# Patient Record
Sex: Female | Born: 1965 | Race: White | Hispanic: No | Marital: Married | Smoking: Never smoker
Health system: Southern US, Community
[De-identification: ages and names within clinical notes are randomized; demographics above are authoritative.]

## PROBLEM LIST (undated history)

## (undated) DIAGNOSIS — F419 Anxiety disorder, unspecified: Secondary | ICD-10-CM

---

## 2004-12-31 ENCOUNTER — Ambulatory Visit: Payer: Self-pay

## 2007-02-28 ENCOUNTER — Ambulatory Visit: Payer: Self-pay | Admitting: Obstetrics and Gynecology

## 2008-04-23 ENCOUNTER — Ambulatory Visit: Payer: Self-pay | Admitting: Obstetrics and Gynecology

## 2009-05-02 ENCOUNTER — Ambulatory Visit: Payer: Self-pay | Admitting: Obstetrics and Gynecology

## 2010-05-22 ENCOUNTER — Ambulatory Visit: Payer: Self-pay | Admitting: Obstetrics and Gynecology

## 2011-02-10 ENCOUNTER — Ambulatory Visit: Payer: Self-pay | Admitting: Unknown Physician Specialty

## 2011-05-26 ENCOUNTER — Ambulatory Visit: Payer: Self-pay | Admitting: Obstetrics and Gynecology

## 2012-02-03 ENCOUNTER — Ambulatory Visit: Payer: Self-pay

## 2012-02-03 LAB — RAPID INFLUENZA A&B ANTIGENS

## 2012-06-02 ENCOUNTER — Ambulatory Visit: Payer: Self-pay | Admitting: Obstetrics and Gynecology

## 2012-06-14 ENCOUNTER — Ambulatory Visit: Payer: Self-pay | Admitting: Obstetrics and Gynecology

## 2012-12-19 ENCOUNTER — Ambulatory Visit: Payer: Self-pay | Admitting: Obstetrics and Gynecology

## 2013-06-13 ENCOUNTER — Ambulatory Visit: Payer: Self-pay | Admitting: Obstetrics and Gynecology

## 2013-12-30 IMAGING — MG MM DIGITAL SCREENING BILAT W/ CAD
1 series · 4 of 4 positions shown · non-contrast
Comparison: none

REASON FOR EXAM: SCR MAMMO NO ORDER
COMMENTS:

[R CC · right · 4 of 4 slices shown]
[im 1/4]
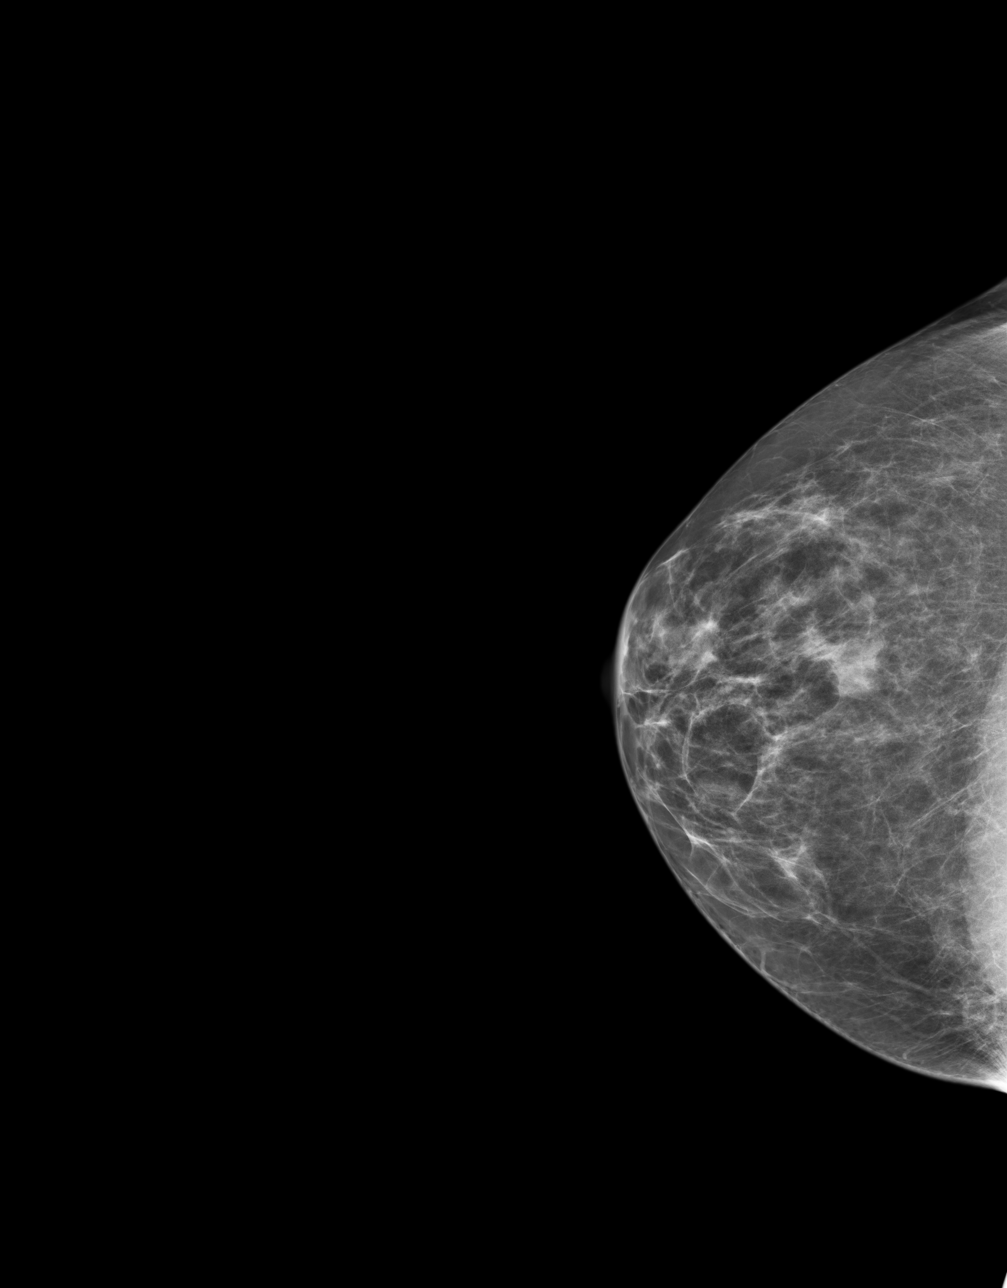
[im 2/4]
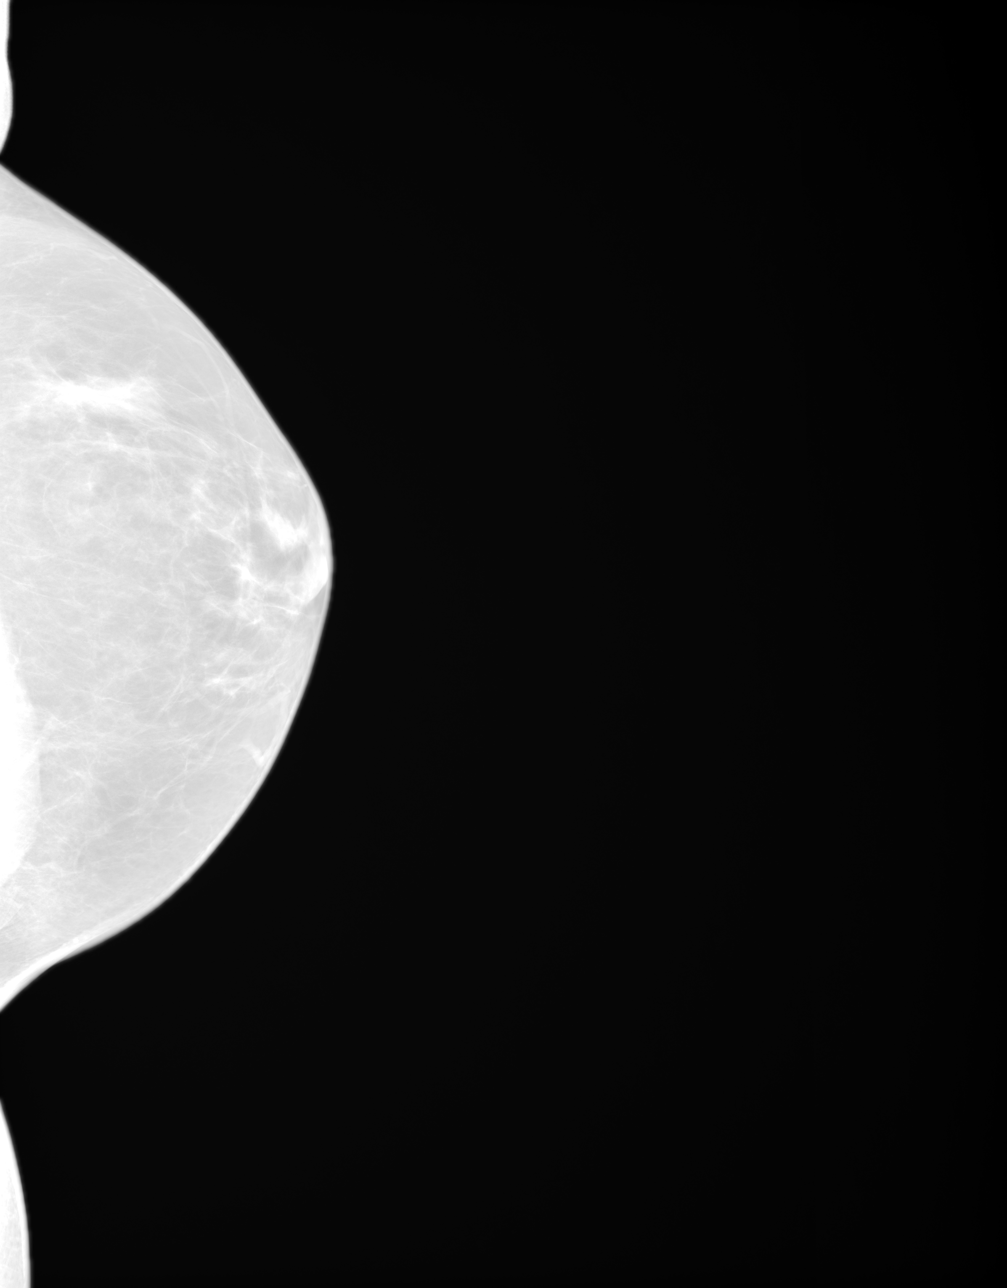
[im 3/4]
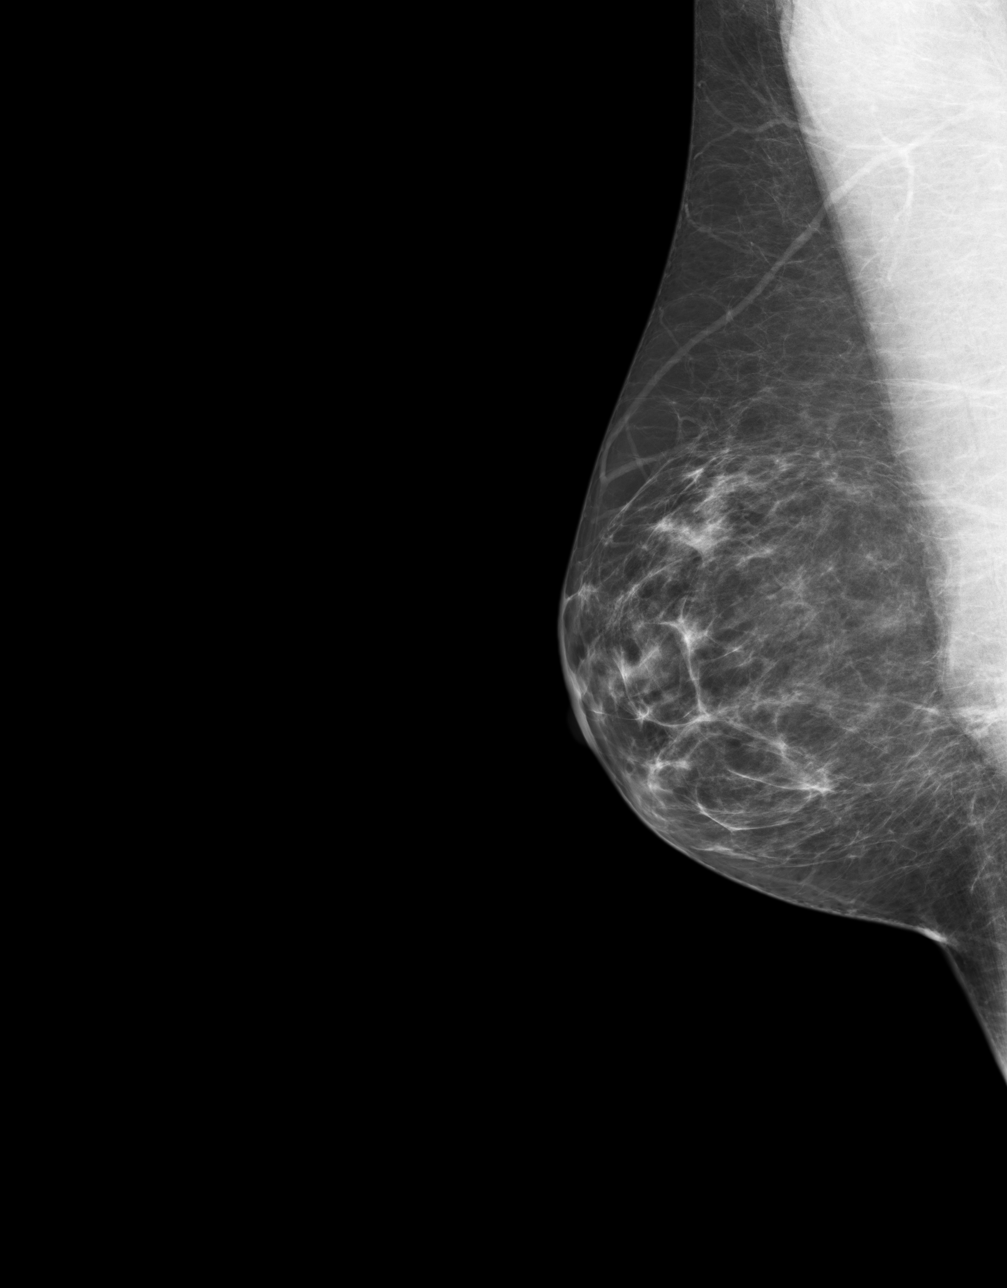
[im 4/4]
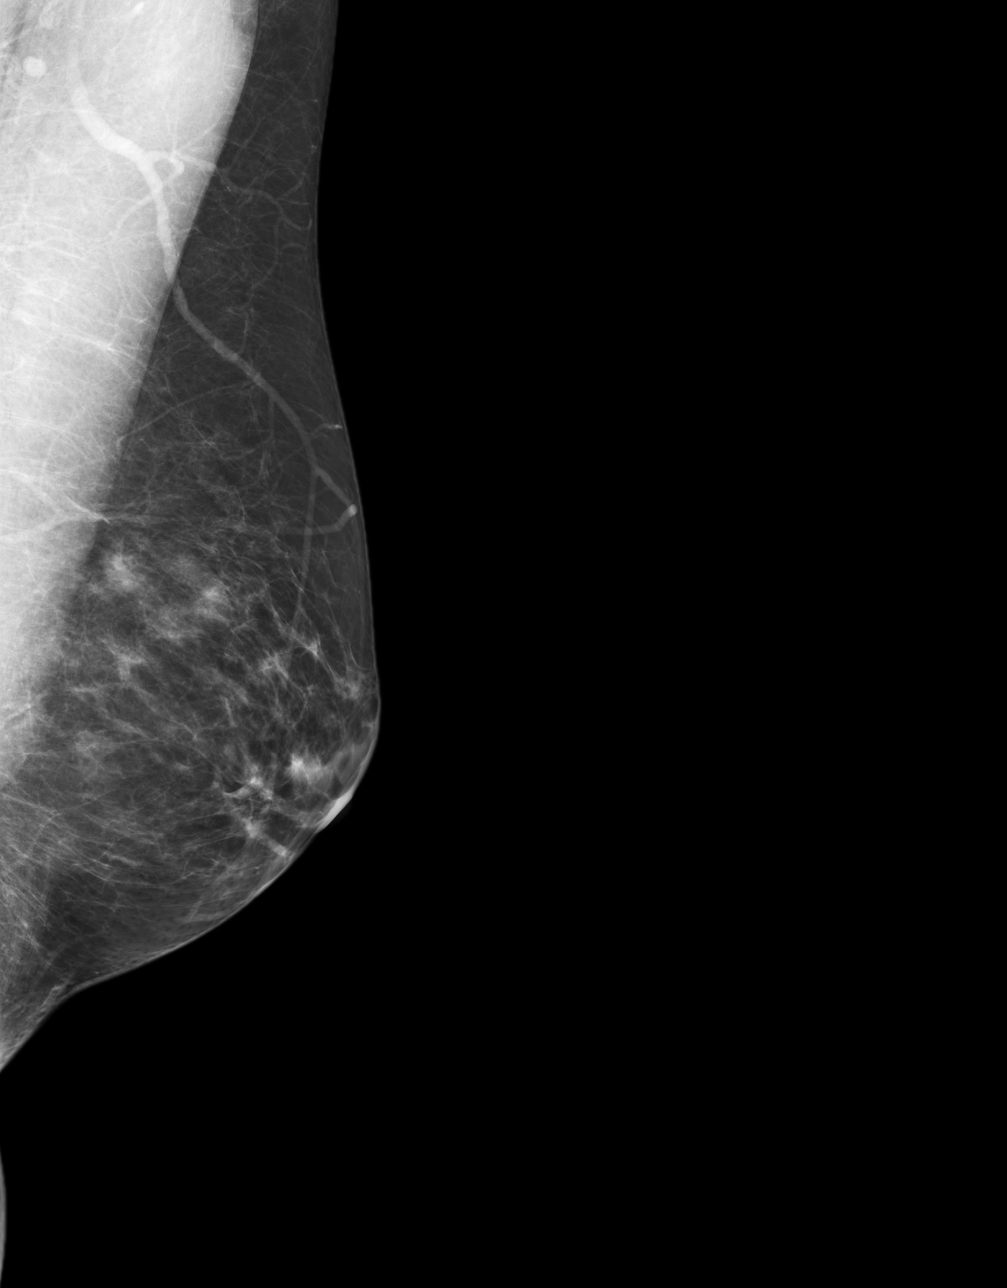

[4 of 4 positions shown; findings below may reference images not displayed]

PROCEDURE:     MMM - MMM DGT SCR NO ORDER W/CAD  - June 13, 2013  [DATE]

RESULT:     There is a family history of breast cancer in the patient's
paternal aunt. The patient has no previous breast surgery or breast cancer
history. Comparison is made to unilateral right breast images dated
12/19/2012 and to bilateral digital images dated 06/02/2012, 05/26/2011 and
04/23/2008 area there is a stable heterogeneous pattern of density in the
breasts without a dominant mass, developing density or malignant appearing
calcification. There is only mild parenchymal density with some asymmetric
parenchymal elements. No malignant calcification or architectural distortion
is present.
IMPRESSION: Stable, benign appearing bilateral mammogram. Please
continue to encourage annual mammographic followup and monthly breast self
exam. BREAST COMPOSITION: The breast composition is SCATTERED FIBROGLANDULAR
TISSUE (glandular tissue is 25-50%) BI-RADS: Category 2- Benign Finding

A NEGATIVE MAMMOGRAM REPORT DOES NOT PRECLUDE BIOPSY OR OTHER EVALUATION OF
A CLINICALLY PALPABLE OR OTHERWISE SUSPICIOUS MASS OR LESION. BREAST CANCER
MAY NOT BE DETECTED IN UP TO 10% OF CASES.

[REDACTED]

## 2014-02-02 ENCOUNTER — Ambulatory Visit: Payer: Self-pay | Admitting: Family Medicine

## 2014-06-14 ENCOUNTER — Ambulatory Visit: Payer: Self-pay | Admitting: Obstetrics and Gynecology

## 2014-06-21 ENCOUNTER — Ambulatory Visit: Payer: Self-pay | Admitting: Obstetrics and Gynecology

## 2015-04-11 ENCOUNTER — Ambulatory Visit
Admit: 2015-04-11 | Disposition: A | Discharge status: Home / Self Care | Payer: Self-pay | Attending: Family Medicine | Admitting: Family Medicine

## 2015-05-22 ENCOUNTER — Other Ambulatory Visit: Payer: Self-pay | Admitting: Obstetrics and Gynecology

## 2015-05-23 ENCOUNTER — Other Ambulatory Visit: Payer: Self-pay | Admitting: Obstetrics and Gynecology

## 2015-05-23 DIAGNOSIS — Z1231 Encounter for screening mammogram for malignant neoplasm of breast: Secondary | ICD-10-CM

## 2015-06-19 ENCOUNTER — Ambulatory Visit
Admission: RE | Admit: 2015-06-19 | Discharge: 2015-06-19 | Disposition: A | Discharge status: Home / Self Care | Payer: BLUE CROSS/BLUE SHIELD | Source: Ambulatory Visit | Attending: Obstetrics and Gynecology | Admitting: Obstetrics and Gynecology

## 2015-06-19 DIAGNOSIS — Z1231 Encounter for screening mammogram for malignant neoplasm of breast: Secondary | ICD-10-CM

## 2016-05-27 ENCOUNTER — Other Ambulatory Visit: Payer: Self-pay | Admitting: Obstetrics and Gynecology

## 2016-05-27 DIAGNOSIS — Z1231 Encounter for screening mammogram for malignant neoplasm of breast: Secondary | ICD-10-CM

## 2016-06-22 ENCOUNTER — Ambulatory Visit
Admission: RE | Admit: 2016-06-22 | Discharge: 2016-06-22 | Disposition: A | Discharge status: Home / Self Care | Payer: BLUE CROSS/BLUE SHIELD | Source: Ambulatory Visit | Attending: Obstetrics and Gynecology | Admitting: Obstetrics and Gynecology

## 2016-06-22 DIAGNOSIS — Z1231 Encounter for screening mammogram for malignant neoplasm of breast: Secondary | ICD-10-CM | POA: Insufficient documentation

## 2016-07-15 ENCOUNTER — Ambulatory Visit (INDEPENDENT_AMBULATORY_CARE_PROVIDER_SITE_OTHER)
Admission: EM | Admit: 2016-07-15 | Discharge: 2016-07-15 | Disposition: A | Discharge status: Hospice | Payer: BLUE CROSS/BLUE SHIELD | Source: Home / Self Care | Attending: Family Medicine | Admitting: Family Medicine

## 2016-07-15 ENCOUNTER — Observation Stay
Admission: EM | Admit: 2016-07-15 | Discharge: 2016-07-16 | Disposition: A | Discharge status: Home / Self Care | Payer: BLUE CROSS/BLUE SHIELD | Attending: Surgery | Admitting: Surgery

## 2016-07-15 ENCOUNTER — Encounter: Payer: Self-pay | Admitting: Emergency Medicine

## 2016-07-15 ENCOUNTER — Ambulatory Visit (INDEPENDENT_AMBULATORY_CARE_PROVIDER_SITE_OTHER)
Admit: 2016-07-15 | Discharge: 2016-07-15 | Disposition: A | Discharge status: Home / Self Care | Payer: BLUE CROSS/BLUE SHIELD | Attending: Family Medicine | Admitting: Family Medicine

## 2016-07-15 ENCOUNTER — Encounter
Admission: EM | Disposition: A | Discharge status: Home / Self Care | Payer: Self-pay | Source: Home / Self Care | Attending: Emergency Medicine

## 2016-07-15 DIAGNOSIS — K37 Unspecified appendicitis: Secondary | ICD-10-CM

## 2016-07-15 DIAGNOSIS — R109 Unspecified abdominal pain: Secondary | ICD-10-CM

## 2016-07-15 DIAGNOSIS — K3580 Unspecified acute appendicitis: Principal | ICD-10-CM | POA: Insufficient documentation

## 2016-07-15 DIAGNOSIS — Z79899 Other long term (current) drug therapy: Secondary | ICD-10-CM | POA: Insufficient documentation

## 2016-07-15 DIAGNOSIS — Z803 Family history of malignant neoplasm of breast: Secondary | ICD-10-CM | POA: Diagnosis not present

## 2016-07-15 DIAGNOSIS — N390 Urinary tract infection, site not specified: Secondary | ICD-10-CM

## 2016-07-15 DIAGNOSIS — N83202 Unspecified ovarian cyst, left side: Secondary | ICD-10-CM | POA: Diagnosis not present

## 2016-07-15 DIAGNOSIS — Z793 Long term (current) use of hormonal contraceptives: Secondary | ICD-10-CM | POA: Diagnosis not present

## 2016-07-15 DIAGNOSIS — K353 Acute appendicitis with localized peritonitis, without perforation or gangrene: Secondary | ICD-10-CM | POA: Insufficient documentation

## 2016-07-15 DIAGNOSIS — Z888 Allergy status to other drugs, medicaments and biological substances status: Secondary | ICD-10-CM | POA: Diagnosis not present

## 2016-07-15 DIAGNOSIS — R1030 Lower abdominal pain, unspecified: Secondary | ICD-10-CM

## 2016-07-15 HISTORY — DX: Anxiety disorder, unspecified: F41.9

## 2016-07-15 HISTORY — PX: LAPAROSCOPIC APPENDECTOMY: SHX408

## 2016-07-15 LAB — COMPREHENSIVE METABOLIC PANEL
ALBUMIN: 3.7 g/dL (ref 3.5–5.0)
ALT: 16 U/L (ref 14–54)
AST: 20 U/L (ref 15–41)
Alkaline Phosphatase: 63 U/L (ref 38–126)
Anion gap: 7 (ref 5–15)
BUN: 12 mg/dL (ref 6–20)
CHLORIDE: 101 mmol/L (ref 101–111)
CO2: 26 mmol/L (ref 22–32)
Calcium: 8.4 mg/dL — ABNORMAL LOW (ref 8.9–10.3)
Creatinine, Ser: 0.9 mg/dL (ref 0.44–1.00)
GFR calc Af Amer: >60 mL/min (ref >60)
GFR calc non Af Amer: >60 mL/min (ref >60)
GLUCOSE: 128 mg/dL — AB (ref 65–99)
POTASSIUM: 4 mmol/L (ref 3.5–5.1)
SODIUM: 134 mmol/L — AB (ref 135–145)
Total Bilirubin: 1 mg/dL (ref 0.3–1.2)
Total Protein: 6.9 g/dL (ref 6.5–8.1)

## 2016-07-15 LAB — URINALYSIS COMPLETE WITH MICROSCOPIC (ARMC ONLY)
Glucose, UA: NEGATIVE mg/dL
Ketones, ur: 15 mg/dL — AB
LEUKOCYTES UA: NEGATIVE
Nitrite: POSITIVE — AB
PH: 5.5 (ref 5.0–8.0)
PROTEIN: 100 mg/dL — AB

## 2016-07-15 LAB — CBC WITH DIFFERENTIAL/PLATELET
Basophils Absolute: 0 10*3/uL (ref 0–0.1)
Basophils Relative: 0 %
Eosinophils Absolute: 0 10*3/uL (ref 0–0.7)
Eosinophils Relative: 0 %
HEMATOCRIT: 37.9 % (ref 35.0–47.0)
Hemoglobin: 12.6 g/dL (ref 12.0–16.0)
LYMPHS PCT: 5 %
Lymphs Abs: 0.6 10*3/uL — ABNORMAL LOW (ref 1.0–3.6)
MCH: 30.1 pg (ref 26.0–34.0)
MCHC: 33.3 g/dL (ref 32.0–36.0)
MCV: 90.5 fL (ref 80.0–100.0)
MONO ABS: 0.4 10*3/uL (ref 0.2–0.9)
MONOS PCT: 3 %
NEUTROS ABS: 11 10*3/uL — AB (ref 1.4–6.5)
Neutrophils Relative %: 92 %
Platelets: 235 10*3/uL (ref 150–440)
RBC: 4.19 MIL/uL (ref 3.80–5.20)
RDW: 13.4 % (ref 11.5–14.5)
WBC: 12.1 10*3/uL — ABNORMAL HIGH (ref 3.6–11.0)

## 2016-07-15 LAB — OCCULT BLOOD X 1 CARD TO LAB, STOOL: Fecal Occult Bld: NEGATIVE

## 2016-07-15 LAB — AMYLASE: AMYLASE: 49 U/L (ref 28–100)

## 2016-07-15 LAB — PREGNANCY, URINE: PREG TEST UR: NEGATIVE

## 2016-07-15 LAB — LIPASE, BLOOD: Lipase: 17 U/L (ref 11–51)

## 2016-07-15 SURGERY — APPENDECTOMY, LAPAROSCOPIC
Anesthesia: General | Site: Abdomen | Wound class: Clean Contaminated

## 2016-07-15 MED ORDER — ONDANSETRON 8 MG PO TBDP
8.0000 mg | ORAL_TABLET | Freq: Once | ORAL | Status: AC
  Administered 2016-07-15: 8 mg via ORAL

## 2016-07-15 MED ORDER — ONDANSETRON HCL 4 MG/2ML IJ SOLN
4.0000 mg | Freq: Once | INTRAMUSCULAR | Status: AC
  Administered 2016-07-15: 4 mg via INTRAVENOUS

## 2016-07-15 MED ORDER — SODIUM CHLORIDE 0.9 % IV BOLUS (SEPSIS)
1000.0000 mL | Freq: Once | INTRAVENOUS | Status: AC
  Administered 2016-07-15: 1000 mL via INTRAVENOUS

## 2016-07-15 MED ORDER — METRONIDAZOLE IN NACL 5-0.79 MG/ML-% IV SOLN
500.0000 mg | INTRAVENOUS | Status: AC
  Administered 2016-07-16: 500 mg via INTRAVENOUS
  Filled 2016-07-15: qty 100

## 2016-07-15 MED ORDER — CHLORHEXIDINE GLUCONATE CLOTH 2 % EX PADS
6.0000 | MEDICATED_PAD | Freq: Once | CUTANEOUS | Status: DC
  Filled 2016-07-15: qty 6

## 2016-07-15 MED ORDER — KETOROLAC TROMETHAMINE 30 MG/ML IJ SOLN: 30.0000 mg | Freq: Once | INTRAMUSCULAR | Status: DC

## 2016-07-15 MED ORDER — MORPHINE SULFATE (PF) 4 MG/ML IV SOLN
INTRAVENOUS | Status: AC
  Administered 2016-07-15: 4 mg via INTRAVENOUS
  Filled 2016-07-15: qty 1

## 2016-07-15 MED ORDER — CEFAZOLIN SODIUM-DEXTROSE 2-4 GM/100ML-% IV SOLN
2.0000 g | INTRAVENOUS | Status: AC
  Administered 2016-07-16: 2 g via INTRAVENOUS
  Filled 2016-07-15: qty 100

## 2016-07-15 MED ORDER — ONDANSETRON HCL 4 MG/2ML IJ SOLN
INTRAMUSCULAR | Status: AC
  Administered 2016-07-15: 4 mg via INTRAVENOUS
  Filled 2016-07-15: qty 2

## 2016-07-15 MED ORDER — MORPHINE SULFATE (PF) 4 MG/ML IV SOLN
4.0000 mg | Freq: Once | INTRAVENOUS | Status: AC
  Administered 2016-07-15: 4 mg via INTRAVENOUS

## 2016-07-15 MED ORDER — HYDROMORPHONE HCL 1 MG/ML IJ SOLN
0.5000 mg | Freq: Once | INTRAMUSCULAR | Status: AC
  Administered 2016-07-15: 0.5 mg via INTRAVENOUS

## 2016-07-15 MED ORDER — IOPAMIDOL (ISOVUE-300) INJECTION 61%
100.0000 mL | Freq: Once | INTRAVENOUS | Status: AC | PRN
  Administered 2016-07-15: 100 mL via INTRAVENOUS

## 2016-07-15 SURGICAL SUPPLY — 35 items
APPLIER CLIP 5 13 M/L LIGAMAX5 (MISCELLANEOUS) ×3
BLADE CLIPPER SURG (BLADE) ×3 IMPLANT
CANISTER SUCT 1200ML W/VALVE (MISCELLANEOUS) ×3 IMPLANT
CHLORAPREP W/TINT 26ML (MISCELLANEOUS) ×3 IMPLANT
CLIP APPLIE 5 13 M/L LIGAMAX5 (MISCELLANEOUS) ×1 IMPLANT
CUTTER FLEX LINEAR 45M (STAPLE) ×3 IMPLANT
ELECT REM PT RETURN 9FT ADLT (ELECTROSURGICAL) ×3
ELECTRODE REM PT RTRN 9FT ADLT (ELECTROSURGICAL) ×1 IMPLANT
ENDOPOUCH RETRIEVER 10 (MISCELLANEOUS) ×3 IMPLANT
GLOVE BIO SURGEON STRL SZ7 (GLOVE) ×9 IMPLANT
GOWN STRL REUS W/ TWL LRG LVL3 (GOWN DISPOSABLE) ×2 IMPLANT
GOWN STRL REUS W/TWL LRG LVL3 (GOWN DISPOSABLE) ×4
IRRIGATION STRYKERFLOW (MISCELLANEOUS) ×1 IMPLANT
IRRIGATOR STRYKERFLOW (MISCELLANEOUS) ×3
IV SOD CHL 0.9% 1000ML (IV SOLUTION) ×3 IMPLANT
LIQUID BAND (GAUZE/BANDAGES/DRESSINGS) ×3 IMPLANT
NEEDLE HYPO 25X1 1.5 SAFETY (NEEDLE) ×3 IMPLANT
NS IRRIG 500ML POUR BTL (IV SOLUTION) ×3 IMPLANT
PACK LAP CHOLECYSTECTOMY (MISCELLANEOUS) ×3 IMPLANT
PENCIL ELECTRO HAND CTR (MISCELLANEOUS) ×3 IMPLANT
RELOAD 45 VASCULAR/THIN (ENDOMECHANICALS) IMPLANT
RELOAD STAPLE TA45 3.5 REG BLU (ENDOMECHANICALS) ×3 IMPLANT
SCALPEL HARMONIC ACE (MISCELLANEOUS) ×3 IMPLANT
SCISSORS METZENBAUM CVD 33 (INSTRUMENTS) ×3 IMPLANT
SLEEVE ENDOPATH XCEL 5M (ENDOMECHANICALS) ×3 IMPLANT
SPONGE LAP 4X18 5PK (MISCELLANEOUS) ×3 IMPLANT
SUT MNCRL AB 4-0 PS2 18 (SUTURE) ×3 IMPLANT
SUT VICRYL 0 AB UR-6 (SUTURE) ×6 IMPLANT
SYR 20CC LL (SYRINGE) ×3 IMPLANT
TRAY FOLEY W/METER SILVER 16FR (SET/KITS/TRAYS/PACK) IMPLANT
TROCAR XCEL BLUNT TIP 100MML (ENDOMECHANICALS) ×3 IMPLANT
TROCAR XCEL NON-BLD 5MMX100MML (ENDOMECHANICALS) ×6 IMPLANT
TUBING CONNECTING 10 (TUBING) ×2 IMPLANT
TUBING CONNECTING 10' (TUBING) ×1
TUBING INSUF HEATED (TUBING) ×3 IMPLANT

## 2016-07-15 NOTE — ED Notes (Signed)
Surgeon at bedside.  

## 2016-07-15 NOTE — ED Notes (Signed)
PT. Verbalizes no remaining questions or concerns regarding surgery, family agrees.

## 2016-07-15 NOTE — ED Notes (Signed)
Pt. Transported to OR.

## 2016-07-15 NOTE — ED Notes (Signed)
Pt awaiting to go to OR.

## 2016-07-15 NOTE — ED Triage Notes (Signed)
Pt presents to ED with reports of RLQ abdominal pain. Pt was seen at [facility] and diagnosed with acute appendicitis by CT scan. [facility] requested pt come via EMS but pt drove herself to ED.

## 2016-07-15 NOTE — ED Provider Notes (Signed)
[facility] Emergency Department Provider Note    ____________________________________________   I have reviewed the triage vital signs and the nursing notes.   HISTORY  Chief Complaint Abdominal Pain   History limited by: Not Limited   HPI Marisa Valentine is a 50 y.o. female who presents from urgent care today because of concern for early appendicitis seen on CT. The patient states that she started having abdominal pain a couple of days ago. Her pain was located in the upper abdomen. This was accompanied by nausea and decreased oral intake. The patient states that she has had similar pain on and off for months. She did not appreciate any fever at home however did have a fever at Urgent care. Denies any diarrhea or bloody stool.    History reviewed. No pertinent past medical history.  There are no active problems to display for this patient.   History reviewed. No pertinent surgical history.  Prior to Admission medications   Medication Sig Start Date End Date Taking? Authorizing Provider  norethindrone-ethinyl estradiol (FEMHRT 1/5) 1-5 MG-MCG TABS tablet Take 1 tablet by mouth daily.    Historical Provider, MD    Allergies Review of patient's allergies indicates no known allergies.  Family History  Problem Relation Age of Onset  . Breast cancer Paternal Aunt     Social History Social History  Substance Use Topics  . Smoking status: Never Smoker  . Smokeless tobacco: Never Used  . Alcohol use Yes    Review of Systems  Constitutional: Negative for fever. Cardiovascular: Negative for chest pain. Respiratory: Negative for shortness of breath. Gastrointestinal: Positive for abdominal pain. Neurological: Negative for headaches, focal weakness or numbness.  10-point ROS otherwise negative.  ____________________________________________   PHYSICAL EXAM:  VITAL SIGNS: ED Triage Vitals  Enc Vitals Group     BP 07/15/16 1722 115/65      Pulse Rate 07/15/16 1722 90     Resp 07/15/16 1722 18     Temp 07/15/16 1722 98.3 F (36.8 C)     Temp Source 07/15/16 1722 Oral     SpO2 07/15/16 1722 96 %     Weight 07/15/16 1722 210 lb (95.3 kg)     Height 07/15/16 1722  (1.676 m)     Head Circumference --      Peak Flow --      Pain Score 07/15/16 1723 6   Constitutional: Alert and oriented. Well appearing and in no distress. Eyes: Conjunctivae are normal. PERRL. Normal extraocular movements. ENT   Head: Normocephalic and atraumatic.   Nose: No congestion/rhinnorhea.   Mouth/Throat: Mucous membranes are moist.   Neck: No stridor. Hematological/Lymphatic/Immunilogical: No cervical lymphadenopathy. Cardiovascular: Normal rate, regular rhythm.  No murmurs, rubs, or gallops. Respiratory: Normal respiratory effort without tachypnea nor retractions. Breath sounds are clear and equal bilaterally. No wheezes/rales/rhonchi. Gastrointestinal: Soft and nontender. No distention. There is no CVA tenderness. Genitourinary: Deferred Musculoskeletal: Normal range of motion in all extremities. No joint effusions.  No lower extremity tenderness nor edema. Neurologic:  Normal speech and language. No gross focal neurologic deficits are appreciated.  Skin:  Skin is warm, dry and intact. No rash noted. Psychiatric: Mood and affect are normal. Speech and behavior are normal. Patient exhibits appropriate insight and judgment.  ____________________________________________    LABS (pertinent positives/negatives)  None  ____________________________________________   EKG  None  ____________________________________________    RADIOLOGY  None   ____________________________________________   PROCEDURES  Procedures  ____________________________________________   INITIAL  IMPRESSION / ASSESSMENT AND PLAN / ED COURSE  Pertinent labs & imaging results that were available during my care of the patient were  reviewed by me and considered in my medical decision making (see chart for details).  Patient presents from urgent care today because of concern for early appendicitis. Patient was seen by Dr. Everlene Farrier who will admit.   ____________________________________________   FINAL CLINICAL IMPRESSION(S) / ED DIAGNOSES  Final diagnoses:  Abdominal pain, unspecified abdominal location  Appendicitis, unspecified appendicitis type     Note: This dictation was prepared with Dragon dictation. Any transcriptional errors that result from this process are unintentional    Phineas Semen, MD 07/15/16 [phone]

## 2016-07-15 NOTE — ED Provider Notes (Signed)
[facility]    CSN: [id number] Arrival date & time: 07/15/16  1412  First Provider Contact:  First MD Initiated Contact with Patient 07/15/16 1421     Patient's here   History   Chief Complaint Chief Complaint  Patient presents with  . Abdominal Pain    HPI Marisa Valentine is a 50 y.o. female.   Patient is here because of abdominal pain. She reports vague abdominal pain with nausea vomiting and today some diarrhea. Last sexually 8 with Monday she did eat a hamburger helper and pizza on Monday night. She states she woke up Tuesday with abdominal pain. She felt cramping sensation with abdominal distention and nausea, w/vomiting. Today she started having diarrhea and gas min ongoing. She states that the abdominal pain is mostly in the midepigastric area. She also reports that she's had something similar like this at least 3 other times in the last 6 months. However usually don't the symptoms lasted day and by the next day she's better this time the symptoms have persisted there is no history of colon cancer in the family her grandmother had diverticulitis and is no history of any significant call bladder gallstones trouble in the family either. She's not any chronic medications other than birth control. No pertinent medical history other than skin biopsy no surgical history she does not smoke. Her mother is in the hospital at this time being evaluated for lung mass at this patient's for his lung cancer.      The history is provided by the patient. No language interpreter was used.  Abdominal Pain  Pain location:  Epigastric Pain quality: aching, bloating, cramping, fullness and throbbing   Pain radiates to:  Does not radiate Pain severity:  Moderate Timing:  Constant Progression:  Worsening Chronicity:  Recurrent Context: not diet changes, not laxative use, not medication withdrawal, not previous surgeries, not recent illness, not suspicious food intake and not trauma     Relieved by:  Nothing Associated symptoms: anorexia, diarrhea and fever   Associated symptoms: no fatigue   Risk factors: obesity   Risk factors: no alcohol abuse, not elderly, has not had multiple surgeries and not pregnant     History reviewed. No pertinent past medical history.  There are no active problems to display for this patient.   History reviewed. No pertinent surgical history.  OB History    No data available       Home Medications    Prior to Admission medications   Medication Sig Start Date End Date Taking? Authorizing Provider  norethindrone-ethinyl estradiol (FEMHRT 1/5) 1-5 MG-MCG TABS tablet Take 1 tablet by mouth daily.   Yes Historical Provider, MD    Family History Family History  Problem Relation Age of Onset  . Breast cancer Paternal Aunt     Social History Social History  Substance Use Topics  . Smoking status: Never Smoker  . Smokeless tobacco: Never Used  . Alcohol use Yes     Allergies   Review of patient's allergies indicates no known allergies.   Review of Systems Review of Systems  Constitutional: Positive for fever. Negative for fatigue.  Gastrointestinal: Positive for abdominal pain, anorexia and diarrhea.  All other systems reviewed and are negative.    Physical Exam Triage Vital Signs ED Triage Vitals  Enc Vitals Group     BP 07/15/16 1416 119/63     Pulse Rate 07/15/16 1416 100     Resp 07/15/16 1416 16  Temp 07/15/16 1416 (!) 100.4 F (38 C)     Temp Source 07/15/16 1416 Tympanic     SpO2 07/15/16 1416 100 %     Weight 07/15/16 1416 210 lb (95.3 kg)     Height 07/15/16 1416 5\' 6"  (1.676 m)     Head Circumference --      Peak Flow --      Pain Score 07/15/16 1419 6     Pain Loc --      Pain Edu? --      Excl. in GC? --    No data found.   Updated Vital Signs BP 119/63 (BP Location: Left Arm)   Pulse 100   Temp (!) 100.4 F (38 C) (Tympanic)   Resp 16   Ht 5\' 6"  (1.676 m)   Wt 210 lb (95.3 kg)    LMP 06/24/2016 (Approximate)   SpO2 100%   BMI 33.89 kg/m   Visual Acuity Right Eye Distance:   Left Eye Distance:   Bilateral Distance:    Right Eye Near:   Left Eye Near:    Bilateral Near:     Physical Exam  Constitutional: She is oriented to person, place, and time. She appears well-developed and well-nourished. No distress.  HENT:  Head: Normocephalic and atraumatic.  Eyes: Pupils are equal, round, and reactive to light.  Neck: Normal range of motion. Neck supple.  Cardiovascular: Normal rate, regular rhythm and normal heart sounds.   Pulmonary/Chest: Effort normal and breath sounds normal. No respiratory distress.  Abdominal: Soft. She exhibits distension. There is no hepatosplenomegaly. There is tenderness in the left lower quadrant. There is no rigidity, no guarding and no CVA tenderness. No hernia.    While patient has some mild mid epigastric discomfort most of the discomfort was in the left lower quadrant. Rectal examination also revealed tenderness in the left lower quadrant area as well.  Genitourinary: Rectal exam shows external hemorrhoid and tenderness. Rectal exam shows no fissure and no mass.  Musculoskeletal: Normal range of motion. She exhibits no edema or deformity.  Neurological: She is alert and oriented to person, place, and time.  Skin: Skin is warm and dry. She is not diaphoretic.  Psychiatric: She has a normal mood and affect. Her behavior is normal.     UC Treatments / Results  Labs (all labs ordered are listed, but only abnormal results are displayed) Labs Reviewed  COMPREHENSIVE METABOLIC PANEL - Abnormal; Notable for the following:       Result Value   Sodium 134 (*)    Glucose, Bld 128 (*)    Calcium 8.4 (*)    All other components within normal limits  CBC WITH DIFFERENTIAL/PLATELET - Abnormal; Notable for the following:    WBC 12.1 (*)    Neutro Abs 11.0 (*)    Lymphs Abs 0.6 (*)    All other components within normal limits   URINALYSIS COMPLETEWITH MICROSCOPIC ([facility] ONLY) - Abnormal; Notable for the following:    Color, Urine AMBER (*)    APPearance CLOUDY (*)    Bilirubin Urine MODERATE (*)    Ketones, ur 15 (*)    Specific Gravity, Urine >1.030 (*)    Hgb urine dipstick MODERATE (*)    Protein, ur 100 (*)    Nitrite POSITIVE (*)    Bacteria, UA MANY (*)    Squamous Epithelial / LPF 6-30 (*)    All other components within normal limits  URINE CULTURE  AMYLASE  LIPASE, BLOOD  PREGNANCY, URINE  OCCULT BLOOD X 1 CARD TO LAB, STOOL    EKG  EKG Interpretation None       Radiology Ct Abdomen Pelvis W Contrast  Result Date: 07/15/2016 CLINICAL DATA:  Left lower quadrant Abdominal pain with nausea and vomiting for 2 days, initial encounter EXAM: CT ABDOMEN AND PELVIS WITH CONTRAST TECHNIQUE: Multidetector CT imaging of the abdomen and pelvis was performed using the standard protocol following bolus administration of intravenous contrast. CONTRAST:  ISOVUE-300 IOPAMIDOL (ISOVUE-300) INJECTION 61% COMPARISON:  None. FINDINGS: Lower chest:  No acute findings. Hepatobiliary: No masses or other significant abnormality. Pancreas: No mass, inflammatory changes, or other significant abnormality. Spleen: Within normal limits in size and appearance. Adrenals/Urinary Tract: No masses identified. No evidence of hydronephrosis. Stomach/Bowel: Mild prominence of the appendix is noted centrally to 15 mm. Some mild periappendiceal inflammatory changes are seen. These changes are consistent with early acute appendicitis. No other significant bowel abnormality is noted. Vascular/Lymphatic: No pathologically enlarged lymph nodes. No evidence of abdominal aortic aneurysm. Reproductive: 2.1 cm left ovarian cyst is noted. The uterus is within normal limits. Other: None. Musculoskeletal: No acute abnormality is noted. Fusion of L3 and L4 is noted. IMPRESSION: Findings consistent with early appendicitis. These results will be  called to the ordering clinician or representative by the Radiologist Assistant, and communication documented in the PACS or zVision Dashboard. Electronically Signed   By: Alcide Clever M.D.   On: 07/15/2016 16:25   Procedures Procedures (including critical care time)  Medications Ordered in UC Medications  ondansetron (ZOFRAN-ODT) disintegrating tablet 8 mg (8 mg Oral Given 07/15/16 1422)     Initial Impression / Assessment and Plan / UC Course  I have reviewed the triage vital signs and the nursing notes.  Pertinent labs & imaging results that were available during my care of the patient were reviewed by me and considered in my medical decision making (see chart for details).  Clinical Course    Patient seemed to have diverticulitis. Will start a saline lock more than likely was just give her IV contrast and do a CT scan of her abdomen as well.   Final Clinical Impressions(s) / UC Diagnoses   Final diagnoses:  Lower abdominal pain  Acute appendicitis, unspecified acute appendicitis type  UTI (lower urinary tract infection)  Patient was informed that CT scan shows that she has what appears be appendicitis. She may also have a UTI with urine been abnormal and urine culture was obtained. We'll hold off giving her the IV Toradol and discussed case with Herbert Seta charge this [facility] ED and patient be sent there for evaluation by the surgeon on-call.  New Prescriptions New Prescriptions   No medications on file     Hassan Rowan, MD 07/15/16 1651

## 2016-07-15 NOTE — ED Triage Notes (Signed)
Patient c/o abdominal pain with N/V/D that started yesterday.  Patient states that she had this a month ago.

## 2016-07-15 NOTE — ED Notes (Signed)
EDP at bedside  

## 2016-07-15 NOTE — ED Notes (Signed)
Prior Authorization for CT scan [id number].

## 2016-07-15 NOTE — ED Notes (Signed)
OR called for and received report for pt.

## 2016-07-15 NOTE — H&P (Signed)
Patient ID: Marisa Valentine, female   DOB: 04/13/1966, 50 y.o.   MRN: [id number]  HPI Marisa Valentine is a 50 y.o. female in consultation in the emergency room for abdominal pain. He reports that the pain started yesterday and initially was diffuse and now is located in the right lower quadrant. Pain is intermittent and sharp and moderate in intensity. She has are dictated nausea and vomiting. No fevers no chills. Of note she reports that she has had this episodes over the last 6 months at total of 4 of them in the past and they have subsided spontaneously. At this episode is more severe. Also she reports feeling weak and with decreased energy. And she does have decreased appetite. He has good cardiovascular reserve and is able to perform more than 4 Mets without any shortness of breath or chest pain. A CT scan was performed that have personally reviewed there is evidence of dilated appendix with periappendiceal inflammation consistent with acute appendicitis there is no evidence of free air or abscess.  HPI  History reviewed. No pertinent past medical history.  History reviewed. No pertinent surgical history.  Family History  Problem Relation Age of Onset  . Breast cancer Paternal Aunt     Social History Social History  Substance Use Topics  . Smoking status: Never Smoker  . Smokeless tobacco: Never Used  . Alcohol use Yes    Allergies  Allergen Reactions  . Etodolac Other (See Comments)    Mouth sores    No current facility-administered medications for this encounter.    Current Outpatient Prescriptions  Medication Sig Dispense Refill  . escitalopram (LEXAPRO) 20 MG tablet Take 20 mg by mouth daily.    . norethindrone-ethinyl estradiol (FEMHRT 1/5) 1-5 MG-MCG TABS tablet Take 1 tablet by mouth daily.       Review of Systems A 10 point review of systems was asked and was negative except for the information on the HPI  Physical Exam Blood pressure 114/62, pulse 76,  temperature 98.1 F (36.7 C), temperature source Oral, resp. rate 20, height 5\' 6"  (1.676 m), weight 95.3 kg (210 lb), last menstrual period 06/24/2016, SpO2 98 %.   CONSTITUTIONAL: NAD obese female EYES: Pupils are equal, round, and reactive to light, Sclera are non-icteric. EARS, NOSE, MOUTH AND THROAT: The oropharynx is clear. The oral mucosa is pink and moist. Hearing is intact to voice. LYMPH NODES:  Lymph nodes in the neck are normal. RESPIRATORY:  Lungs are clear. There is normal respiratory effort, with equal breath sounds bilaterally, and without pathologic use of accessory muscles. CARDIOVASCULAR: Heart is regular without murmurs, gallops, or rubs. GI: The abdomen is  soft, TTP RLQ, no peritonitis, no hernias GU: Rectal deferred.   MUSCULOSKELETAL: Normal muscle strength and tone. No cyanosis or edema.   SKIN: Turgor is good and there are no pathologic skin lesions or ulcers. NEUROLOGIC: Motor and sensation is grossly normal. Cranial nerves are grossly intact. PSYCH:  Oriented to person, place and time. Affect is normal.  Data Reviewed  I have personally reviewed the patient's imaging, laboratory findings and medical records.    Assessment/  Plan   right lower quadrant pain with increased white count and recurrent episodes I clinically unusual for classic appendicitis as far as her story goes but definitely  itis concerning for an ischial pathology such as a mucocele or small tumor that might be causing dilation and appendicitis. Discussed with the patient in detail about my concerns and given her  age and her recurrent symptoms and her CT findings I do recommend appendectomy. Discussed with the patient in detail about other options including observation and antibiotic management. I do think that because of the concern of mucocele we do need histology to exclude at tumor.  The risks, benefits, complications, treatment options, and expected outcomes were discussed with the patient. The  treatment of antibiotics alone was discussed giving a 20% chance that this could fail and surgery would be necessary.  Also discussed continuing to the operating room for Laparoscopic Appendectomy.  The possibilities of  bleeding, recurrent infection, perforation of viscus, finding a normal appendix, the need for additional procedures, failure to diagnose a condition, conversion to open procedure and creating a complication requiring transfusion or further operations were discussed. The patient was given the opportunity to ask questions and have them answered.  Patient would like to proceed with Laparoscopic Appendectomy  possible and consent was obtained. Extesnsive counseling provided   Sterling Big, MD FACS General Surgeon 07/15/2016, 7:45 PM

## 2016-07-16 ENCOUNTER — Encounter: Payer: Self-pay | Admitting: Anesthesiology

## 2016-07-16 ENCOUNTER — Emergency Department: Payer: BLUE CROSS/BLUE SHIELD | Admitting: Certified Registered"

## 2016-07-16 DIAGNOSIS — K37 Unspecified appendicitis: Secondary | ICD-10-CM

## 2016-07-16 MED ORDER — PROMETHAZINE HCL 25 MG/ML IJ SOLN: 6.2500 mg | INTRAMUSCULAR | Status: DC | PRN

## 2016-07-16 MED ORDER — ONDANSETRON HCL 4 MG/2ML IJ SOLN
INTRAMUSCULAR | Status: DC | PRN
  Administered 2016-07-16: 4 mg via INTRAVENOUS

## 2016-07-16 MED ORDER — MORPHINE SULFATE (PF) 2 MG/ML IV SOLN: 2.0000 mg | INTRAVENOUS | Status: DC | PRN

## 2016-07-16 MED ORDER — FENTANYL CITRATE (PF) 100 MCG/2ML IJ SOLN: 25.0000 ug | INTRAMUSCULAR | Status: DC | PRN

## 2016-07-16 MED ORDER — FENTANYL CITRATE (PF) 100 MCG/2ML IJ SOLN
INTRAMUSCULAR | Status: DC | PRN
  Administered 2016-07-16 (×2): 50 ug via INTRAVENOUS
  Administered 2016-07-16: 150 ug via INTRAVENOUS

## 2016-07-16 MED ORDER — KETOROLAC TROMETHAMINE 30 MG/ML IJ SOLN: 30.0000 mg | Freq: Four times a day (QID) | INTRAMUSCULAR | Status: DC | PRN

## 2016-07-16 MED ORDER — ONDANSETRON HCL 4 MG/2ML IJ SOLN: 4.0000 mg | Freq: Four times a day (QID) | INTRAMUSCULAR | Status: DC | PRN

## 2016-07-16 MED ORDER — OXYCODONE-ACETAMINOPHEN 5-325 MG PO TABS: 1.0000 | ORAL_TABLET | ORAL | Status: DC | PRN

## 2016-07-16 MED ORDER — PROPOFOL 10 MG/ML IV BOLUS
INTRAVENOUS | Status: DC | PRN
  Administered 2016-07-16: 200 mg via INTRAVENOUS

## 2016-07-16 MED ORDER — KETOROLAC TROMETHAMINE 30 MG/ML IJ SOLN
INTRAMUSCULAR | Status: DC | PRN
  Administered 2016-07-16: 30 mg via INTRAVENOUS

## 2016-07-16 MED ORDER — CYCLOBENZAPRINE HCL 10 MG PO TABS
10.0000 mg | ORAL_TABLET | Freq: Three times a day (TID) | ORAL | Status: DC
  Administered 2016-07-16: 10 mg via ORAL
  Filled 2016-07-16: qty 1

## 2016-07-16 MED ORDER — SUGAMMADEX SODIUM 200 MG/2ML IV SOLN
INTRAVENOUS | Status: DC | PRN
  Administered 2016-07-16: 200 mg via INTRAVENOUS

## 2016-07-16 MED ORDER — SUCCINYLCHOLINE CHLORIDE 20 MG/ML IJ SOLN
INTRAMUSCULAR | Status: DC | PRN
  Administered 2016-07-16: 100 mg via INTRAVENOUS

## 2016-07-16 MED ORDER — PANTOPRAZOLE SODIUM 40 MG PO TBEC
40.0000 mg | DELAYED_RELEASE_TABLET | Freq: Every day | ORAL | Status: DC
  Administered 2016-07-16: 40 mg via ORAL
  Filled 2016-07-16: qty 1

## 2016-07-16 MED ORDER — ENOXAPARIN SODIUM 40 MG/0.4ML ~~LOC~~ SOLN
40.0000 mg | SUBCUTANEOUS | Status: DC
  Administered 2016-07-16: 40 mg via SUBCUTANEOUS
  Filled 2016-07-16: qty 0.4

## 2016-07-16 MED ORDER — FAMOTIDINE IN NACL 20-0.9 MG/50ML-% IV SOLN
20.0000 mg | Freq: Two times a day (BID) | INTRAVENOUS | Status: DC
  Filled 2016-07-16 (×2): qty 50

## 2016-07-16 MED ORDER — BUPIVACAINE-EPINEPHRINE 0.25% -1:200000 IJ SOLN
INTRAMUSCULAR | Status: DC | PRN
Start: 2016-07-16 — End: 2016-07-16
  Administered 2016-07-16: 20 mL

## 2016-07-16 MED ORDER — LIDOCAINE HCL (CARDIAC) 20 MG/ML IV SOLN
INTRAVENOUS | Status: DC | PRN
  Administered 2016-07-16: 50 mg via INTRAVENOUS

## 2016-07-16 MED ORDER — DIPHENHYDRAMINE HCL 25 MG PO CAPS: 25.0000 mg | ORAL_CAPSULE | Freq: Four times a day (QID) | ORAL | Status: DC | PRN

## 2016-07-16 MED ORDER — DEXAMETHASONE SODIUM PHOSPHATE 10 MG/ML IJ SOLN
INTRAMUSCULAR | Status: DC | PRN
  Administered 2016-07-16: 10 mg via INTRAVENOUS

## 2016-07-16 MED ORDER — ROCURONIUM BROMIDE 100 MG/10ML IV SOLN
INTRAVENOUS | Status: DC | PRN
  Administered 2016-07-16: 35 mg via INTRAVENOUS
  Administered 2016-07-16: 5 mg via INTRAVENOUS
  Administered 2016-07-16: 10 mg via INTRAVENOUS

## 2016-07-16 MED ORDER — CYCLOBENZAPRINE HCL 10 MG PO TABS: 10.0000 mg | ORAL_TABLET | Freq: Three times a day (TID) | ORAL | 0 refills | Status: DC | PRN

## 2016-07-16 MED ORDER — OXYCODONE-ACETAMINOPHEN 5-325 MG PO TABS: 2.0000 | ORAL_TABLET | ORAL | Status: DC | PRN

## 2016-07-16 MED ORDER — MIDAZOLAM HCL 2 MG/2ML IJ SOLN
INTRAMUSCULAR | Status: DC | PRN
  Administered 2016-07-16: 2 mg via INTRAVENOUS

## 2016-07-16 MED ORDER — KETOROLAC TROMETHAMINE 30 MG/ML IJ SOLN
30.0000 mg | Freq: Four times a day (QID) | INTRAMUSCULAR | Status: DC
  Administered 2016-07-16: 30 mg via INTRAVENOUS
  Filled 2016-07-16: qty 1

## 2016-07-16 MED ORDER — SODIUM CHLORIDE 0.9 % IV SOLN
INTRAVENOUS | Status: DC
  Administered 2016-07-16: 04:00:00 via INTRAVENOUS

## 2016-07-16 MED ORDER — ACETAMINOPHEN 10 MG/ML IV SOLN
INTRAVENOUS | Status: DC | PRN
  Administered 2016-07-16: 1000 mg via INTRAVENOUS

## 2016-07-16 MED ORDER — ONDANSETRON 4 MG PO TBDP: 4.0000 mg | ORAL_TABLET | Freq: Four times a day (QID) | ORAL | Status: DC | PRN

## 2016-07-16 MED ORDER — MORPHINE SULFATE (PF) 2 MG/ML IV SOLN
2.0000 mg | INTRAVENOUS | Status: DC | PRN
  Administered 2016-07-16: 2 mg via INTRAVENOUS
  Filled 2016-07-16: qty 1

## 2016-07-16 MED ORDER — OXYCODONE-ACETAMINOPHEN 5-325 MG PO TABS
2.0000 | ORAL_TABLET | ORAL | 0 refills | Status: DC | PRN
Start: 2016-07-16 — End: 2016-07-28

## 2016-07-16 MED ORDER — DIPHENHYDRAMINE HCL 50 MG/ML IJ SOLN: 25.0000 mg | Freq: Four times a day (QID) | INTRAMUSCULAR | Status: DC | PRN

## 2016-07-16 MED ORDER — LACTATED RINGERS IV SOLN
INTRAVENOUS | Status: DC | PRN
  Administered 2016-07-16: 01:00:00 via INTRAVENOUS

## 2016-07-16 NOTE — Anesthesia Procedure Notes (Signed)
Procedure Name: Intubation Performed by: Axavier Pressley Pre-anesthesia Checklist: Patient identified, Patient being monitored, Timeout performed, Emergency Drugs available and Suction available Patient Re-evaluated:Patient Re-evaluated prior to inductionOxygen Delivery Method: Circle system utilized Preoxygenation: Pre-oxygenation with 100% oxygen Intubation Type: IV induction, Rapid sequence and Cricoid Pressure applied Laryngoscope Size: Miller and 2 Grade View: Grade I Tube type: Oral Tube size: 7.0 mm Number of attempts: 1 Airway Equipment and Method: Stylet Placement Confirmation: ETT inserted through vocal cords under direct vision,  positive ETCO2 and breath sounds checked- equal and bilateral Secured at: 20 cm Tube secured with: Tape Dental Injury: Teeth and Oropharynx as per pre-operative assessment        

## 2016-07-16 NOTE — Discharge Summary (Signed)
Physician Discharge Summary  Patient ID: Marisa Valentine MRN: [id number] DOB/AGE: 1966-04-24 50 y.o.  Admit date: 07/15/2016 Discharge date: 07/16/2016  Admission Diagnoses:Acute appendicitis  Discharge Diagnoses:  Active Problems:   Appendicitis   Discharged Condition: good  Hospital Course: 50 yr old female with acute appendicitis, underwent Lap appy with Dr. Everlene Farrier on 7/26.  Patient doing well today.  She is tolerating regular diet, up and walking around and pain well controlled with po meds.   Consults: None  Significant Diagnostic Studies: CT scan  Treatments: antibiotics: Cipro and surgery: Lap appy Dr. Everlene Farrier 7/26  Discharge Exam: Blood pressure (!) 109/56, pulse 71, temperature 97.9 F (36.6 C), temperature source Oral, resp. rate 17, height 5\' 6"  (1.676 m), weight 210 lb (95.3 kg), last menstrual period 06/24/2016, SpO2 99 %. General appearance: alert, cooperative and no distress GI: soft, appropriately tender, incisions c/d/i, no erythema or drainage Extremities: extremities normal, atraumatic, no cyanosis or edema  Disposition: 51-Hospice/Medical Facility  Discharge Instructions    Call MD for:  persistant nausea and vomiting    Complete by:  As directed   Call MD for:  redness, tenderness, or signs of infection (pain, swelling, redness, odor or green/yellow discharge around incision site)    Complete by:  As directed   Call MD for:  severe uncontrolled pain    Complete by:  As directed   Call MD for:  temperature >100.4    Complete by:  As directed   Diet general    Complete by:  As directed   Driving Restrictions    Complete by:  As directed   No driving while on prescription pain medication   Increase activity slowly    Complete by:  As directed   Lifting restrictions    Complete by:  As directed   No lifting over 15lbs for 3 weeks   May shower / Bathe    Complete by:  As directed   May walk up steps    Complete by:  As directed   No dressing needed     Complete by:  As directed       Medication List    TAKE these medications   cyclobenzaprine 10 MG tablet Commonly known as:  FLEXERIL Take 1 tablet (10 mg total) by mouth 3 (three) times daily as needed for muscle spasms.   oxyCODONE-acetaminophen 5-325 MG tablet Commonly known as:  PERCOCET/ROXICET Take 2 tablets by mouth every 4 (four) hours as needed for moderate pain.     ASK your doctor about these medications   escitalopram 20 MG tablet Commonly known as:  LEXAPRO Take 20 mg by mouth daily.   norethindrone-ethinyl estradiol 1-5 MG-MCG Tabs tablet Commonly known as:  FEMHRT 1/5 Take 1 tablet by mouth daily.      Follow-up Information    Leafy Ro, MD Follow up on 07/28/2016.   Specialty:  General Surgery Why:  F/u with Dr. Everlene Farrier on 07/28/2016 at 9:30 arrival for 9:45 appointment  Contact information: [address] [place] [address] [phone]           Signed: Gladis Riffle 07/16/2016, 2:44 PM

## 2016-07-16 NOTE — Progress Notes (Signed)
Patient discharged to home as ordered. Follow up appointments given as ordered. Patient is alert and oriented no acute distress noted. Ambulate without assistance. Iv discontinued site clean dry and intact

## 2016-07-16 NOTE — Op Note (Signed)
laparascopic appendectomy   Marisa Valentine Date of operation:  07/16/2016  Indications: The patient presented with a history of  abdominal pain. Workup has revealed findings consistent with acute appendicitis.  Pre-operative Diagnosis: Acute appendicitis without mention of peritonitis  Post-operative Diagnosis: Same  Surgeon: Sterling Big, MD, FACS  Anesthesia: General with endotracheal tube  Findings: Acute non perforated appendicitis                  No evidence of metastatic disease                   Normal pelvic organs  Estimated Blood Loss: 5cc         Specimens: appendix         Complications:  none  Procedure Details  The patient was seen again in the preop area. The options of surgery versus observation were reviewed with the patient and/or family. The risks of bleeding, infection, recurrence of symptoms, negative laparoscopy, potential for an open procedure, bowel injury, abscess or infection, were all reviewed as well. The patient was taken to Operating Room, identified as [name] and the procedure verified as laparoscopic appendectomy. A Time Out was held and the above information confirmed.  The patient was placed in the supine position and general anesthesia was induced.  Antibiotic prophylaxis was administered and VT E prophylaxis was in place. A Foley catheter was placed by the nursing staff.   The abdomen was prepped and draped in a sterile fashion. An infraumbilical incision was made. A cutdown technique was used to enter the abdominal cavity. Two vicryl stitches were placed on the fascia and a Hasson trocar inserted. Pneumoperitoneum obtained. Two 5 mm ports were placed under direct visualization.   The appendix was identified and found to be acutely inflamed , its mesentery was thick as well. The appendix was carefully dissected. The base of the appendix was dissected out and divided with a standard load Endo GIA. The mesoappendix was divided  withHarmonic scalpel. The appendix was passed out through the left lateral port site with the aid of an Endo Catch bag. The right lower quadrant and pelvis was then irrigated with copious amounts of normal saline which was aspirated. Inspection  failed to identify any additional bleeding and there were no signs of bowel injury.  Again the right lower quadrant was inspected there was no sign of bleeding or bowel injury therefore pneumoperitoneum was released, all ports were removed and the skin incisions were approximated with subcuticular 4-0 Monocryl. Umbilical fascia closed w 0 vicryl . Dermabond was placed.  The patient tolerated the procedure well, there were no complications. The sponge lap and needle count were correct at the end of the procedure.  The patient was taken to the recovery room in stable condition to be admitted for continued care.    Sterling Big, MD FACS

## 2016-07-16 NOTE — Transfer of Care (Signed)
Immediate Anesthesia Transfer of Care Note  Patient: Marisa Valentine  Procedure(s) Performed: Procedure(s): APPENDECTOMY LAPAROSCOPIC (N/A)  Patient Location: PACU  Anesthesia Type:General  Level of Consciousness: awake  Airway & Oxygen Therapy: Patient Spontanous Breathing and Patient connected to face mask oxygen  Post-op Assessment: Report given to RN and Post -op Vital signs reviewed and stable  Post vital signs: Reviewed  Last Vitals:  Vitals:   07/15/16 2036 07/16/16 0217  BP: (!) 107/48 (!) 96/42  Pulse:  76  Resp:  19  Temp:  (P) 36.9 C    Last Pain:  Vitals:   07/15/16 2223  TempSrc:   PainSc: 2          Complications: No apparent anesthesia complications

## 2016-07-16 NOTE — Anesthesia Preprocedure Evaluation (Addendum)
Anesthesia Evaluation  Patient identified by MRN, date of birth, ID band Patient awake    Reviewed: Allergy & Precautions, H&P , NPO status , Patient's Chart, lab work & pertinent test results, reviewed documented beta blocker date and time   History of Anesthesia Complications Negative for: history of anesthetic complications  Airway Mallampati: I  TM Distance: >3 FB Neck ROM: full    Dental no notable dental hx. (+) Caps, Teeth Intact   Pulmonary neg pulmonary ROS,    Pulmonary exam normal breath sounds clear to auscultation       Cardiovascular Exercise Tolerance: Good negative cardio ROS Normal cardiovascular exam Rhythm:regular Rate:Normal     Neuro/Psych negative neurological ROS  negative psych ROS   GI/Hepatic Neg liver ROS, GERD  ,  Endo/Other  negative endocrine ROS  Renal/GU negative Renal ROS  negative genitourinary   Musculoskeletal   Abdominal   Peds  Hematology negative hematology ROS (+)   Anesthesia Other Findings History reviewed. No pertinent past medical history.   Reproductive/Obstetrics negative OB ROS                             Anesthesia Physical Anesthesia Plan  ASA: II  Anesthesia Plan: General, Rapid Sequence and Cricoid Pressure   Post-op Pain Management:    Induction:   Airway Management Planned:   Additional Equipment:   Intra-op Plan:   Post-operative Plan:   Informed Consent: I have reviewed the patients History and Physical, chart, labs and discussed the procedure including the risks, benefits and alternatives for the proposed anesthesia with the patient or authorized representative who has indicated his/her understanding and acceptance.   Dental Advisory Given  Plan Discussed with: Anesthesiologist, CRNA and Surgeon  Anesthesia Plan Comments:         Anesthesia Quick Evaluation

## 2016-07-17 ENCOUNTER — Encounter: Payer: Self-pay | Admitting: Surgery

## 2016-07-17 LAB — SURGICAL PATHOLOGY

## 2016-07-17 NOTE — Anesthesia Postprocedure Evaluation (Signed)
Anesthesia Post Note  Patient: Marisa Valentine  Procedure(s) Performed: Procedure(s) (LRB): APPENDECTOMY LAPAROSCOPIC (N/A)  Patient location during evaluation: PACU Anesthesia Type: General Level of consciousness: awake and alert Pain management: pain level controlled Vital Signs Assessment: post-procedure vital signs reviewed and stable Respiratory status: spontaneous breathing, nonlabored ventilation, respiratory function stable and patient connected to nasal cannula oxygen Cardiovascular status: blood pressure returned to baseline and stable Postop Assessment: no signs of nausea or vomiting Anesthetic complications: no    Last Vitals:  Vitals:   07/16/16 0512 07/16/16 1220  BP: (!) 98/46 (!) 109/56  Pulse: 66 71  Resp: 18 17  Temp: 36.6 C 36.6 C    Last Pain:  Vitals:   07/16/16 1220  TempSrc: Oral  PainSc:                  Lenard Simmer

## 2016-07-18 LAB — URINE CULTURE
Culture: >=100000 — AB
SPECIAL REQUESTS: NORMAL

## 2016-07-21 ENCOUNTER — Telehealth: Payer: Self-pay | Admitting: Emergency Medicine

## 2016-07-21 ENCOUNTER — Telehealth: Payer: Self-pay | Admitting: Surgery

## 2016-07-21 MED ORDER — CIPROFLOXACIN HCL 500 MG PO TABS: 500.0000 mg | ORAL_TABLET | Freq: Two times a day (BID) | ORAL | 0 refills | Status: DC

## 2016-07-21 NOTE — Telephone Encounter (Signed)
Patient's letter was faxed to Mammie Lorenzo (patient's Manager).

## 2016-07-21 NOTE — Telephone Encounter (Signed)
Patient was notified of her urine culture result.  Patient states that she is currently not on any antibiotics at this time.  Patient states that she has NKDA.  Dr. Thurmond Butts reviewed patient's chart and would like Cipro 500mg  1 tablet twice a day with no refills sent to her pharmacy.  Patient would like this sent to [facility] in [place].  Patient was instructed to take the antibiotic as directed and that if her symptoms do not improve or worsen to follow-up here or with her PCP.  Patient verbalized understanding.

## 2016-07-21 NOTE — Telephone Encounter (Signed)
Patient would like a nurse to call her regarding when she can return to work and she will need a work note. Her post op is 07/28/16.

## 2016-07-24 ENCOUNTER — Telehealth: Payer: Self-pay | Admitting: Surgery

## 2016-07-24 NOTE — Telephone Encounter (Signed)
LVM for patient to call office at this time. 

## 2016-07-24 NOTE — Telephone Encounter (Signed)
Spoke with patient and she was instructed to increase water intake, to walk and exercise and to try Miralax.  She stated she has some Miralax and that she will do this. She has stopped taking her pain medication as she doesn't need it anymore. We discussed taking ibuprofen if she has the need for pain medication as it will not cause constipation. Patient verbalized understanding.

## 2016-07-24 NOTE — Telephone Encounter (Signed)
Patient left a voice message that she had surgery on the 27th (appendix) and now having trouble with constipation. What can she use? Please call [phone]

## 2016-07-28 ENCOUNTER — Encounter: Payer: Self-pay | Admitting: Surgery

## 2016-07-28 ENCOUNTER — Ambulatory Visit (INDEPENDENT_AMBULATORY_CARE_PROVIDER_SITE_OTHER): Payer: BLUE CROSS/BLUE SHIELD | Admitting: Surgery

## 2016-07-28 VITALS — BP 142/79 | HR 83 | Temp 97.6°F | Ht 66.0 in | Wt 216.0 lb

## 2016-07-28 DIAGNOSIS — Z09 Encounter for follow-up examination after completed treatment for conditions other than malignant neoplasm: Secondary | ICD-10-CM

## 2016-07-28 NOTE — Patient Instructions (Signed)
Please call if your loose stools become watery diarrhea. We will need to obtain some stool testing.  Continue to drink water as much as possible. We shoot for a goal of 72 oz. Remember with your loose stools to replace your potassium while this continues. High potassium foods are: Bananas and Citrus fruits.  You may return to work tomorrow with restrictions until 08/26/16. Please see note provided.  If you have any questions or concerns, please call our office.

## 2016-07-28 NOTE — Progress Notes (Signed)
S/p lap appy, doing well, some decrease energy Taking PO, + BM, afebrile Path d/w pt  PE NAD Abd: soft, Nt, incisions c/d/i  A/P doing well No complications No heavy lifting

## 2017-06-08 ENCOUNTER — Other Ambulatory Visit: Payer: Self-pay | Admitting: Obstetrics and Gynecology

## 2017-06-08 DIAGNOSIS — Z1231 Encounter for screening mammogram for malignant neoplasm of breast: Secondary | ICD-10-CM

## 2017-06-28 ENCOUNTER — Ambulatory Visit
Admission: RE | Admit: 2017-06-28 | Discharge: 2017-06-28 | Disposition: A | Discharge status: Home / Self Care | Payer: BLUE CROSS/BLUE SHIELD | Source: Ambulatory Visit | Attending: Obstetrics and Gynecology | Admitting: Obstetrics and Gynecology

## 2017-06-28 DIAGNOSIS — Z1231 Encounter for screening mammogram for malignant neoplasm of breast: Secondary | ICD-10-CM | POA: Diagnosis not present

## 2018-06-14 ENCOUNTER — Other Ambulatory Visit: Payer: Self-pay | Admitting: Obstetrics and Gynecology

## 2018-06-14 DIAGNOSIS — Z1231 Encounter for screening mammogram for malignant neoplasm of breast: Secondary | ICD-10-CM

## 2018-07-07 ENCOUNTER — Ambulatory Visit: Payer: BLUE CROSS/BLUE SHIELD

## 2018-07-13 ENCOUNTER — Ambulatory Visit: Payer: BLUE CROSS/BLUE SHIELD

## 2018-07-19 ENCOUNTER — Ambulatory Visit
Admission: RE | Admit: 2018-07-19 | Discharge: 2018-07-19 | Disposition: A | Discharge status: Home / Self Care | Payer: BLUE CROSS/BLUE SHIELD | Source: Ambulatory Visit | Attending: Obstetrics and Gynecology | Admitting: Obstetrics and Gynecology

## 2018-07-19 DIAGNOSIS — Z1231 Encounter for screening mammogram for malignant neoplasm of breast: Secondary | ICD-10-CM | POA: Diagnosis present

## 2019-08-15 ENCOUNTER — Other Ambulatory Visit: Payer: Self-pay | Admitting: Nurse Practitioner

## 2019-08-15 DIAGNOSIS — Z1231 Encounter for screening mammogram for malignant neoplasm of breast: Secondary | ICD-10-CM

## 2019-09-27 ENCOUNTER — Encounter (INDEPENDENT_AMBULATORY_CARE_PROVIDER_SITE_OTHER): Payer: Self-pay

## 2019-09-27 ENCOUNTER — Other Ambulatory Visit: Payer: Self-pay

## 2019-09-27 ENCOUNTER — Ambulatory Visit
Admission: RE | Admit: 2019-09-27 | Discharge: 2019-09-27 | Disposition: A | Discharge status: Home / Self Care | Payer: BC Managed Care – PPO | Source: Ambulatory Visit | Attending: Nurse Practitioner | Admitting: Nurse Practitioner

## 2019-09-27 DIAGNOSIS — Z1231 Encounter for screening mammogram for malignant neoplasm of breast: Secondary | ICD-10-CM | POA: Diagnosis not present

## 2019-09-28 ENCOUNTER — Other Ambulatory Visit: Payer: Self-pay | Admitting: Nurse Practitioner

## 2019-09-28 DIAGNOSIS — N6489 Other specified disorders of breast: Secondary | ICD-10-CM

## 2019-09-28 DIAGNOSIS — R928 Other abnormal and inconclusive findings on diagnostic imaging of breast: Secondary | ICD-10-CM

## 2019-10-12 ENCOUNTER — Ambulatory Visit
Admission: RE | Admit: 2019-10-12 | Discharge: 2019-10-12 | Disposition: A | Discharge status: Home / Self Care | Payer: BC Managed Care – PPO | Source: Ambulatory Visit | Attending: Nurse Practitioner | Admitting: Nurse Practitioner

## 2019-10-12 DIAGNOSIS — R928 Other abnormal and inconclusive findings on diagnostic imaging of breast: Secondary | ICD-10-CM | POA: Diagnosis not present

## 2019-10-12 DIAGNOSIS — N6489 Other specified disorders of breast: Secondary | ICD-10-CM

## 2019-10-16 ENCOUNTER — Other Ambulatory Visit: Payer: Self-pay | Admitting: Nurse Practitioner

## 2019-10-16 DIAGNOSIS — N6320 Unspecified lump in the left breast, unspecified quadrant: Secondary | ICD-10-CM

## 2019-10-18 ENCOUNTER — Other Ambulatory Visit: Payer: BC Managed Care – PPO

## 2019-10-18 ENCOUNTER — Ambulatory Visit: Payer: BC Managed Care – PPO

## 2020-08-04 ENCOUNTER — Ambulatory Visit
Admission: EM | Admit: 2020-08-04 | Discharge: 2020-08-04 | Discharge status: Against Medical Advice | Payer: BC Managed Care – PPO

## 2020-11-21 ENCOUNTER — Other Ambulatory Visit: Payer: Self-pay | Admitting: Nurse Practitioner

## 2020-11-21 DIAGNOSIS — N6320 Unspecified lump in the left breast, unspecified quadrant: Secondary | ICD-10-CM

## 2020-12-25 ENCOUNTER — Other Ambulatory Visit: Payer: BC Managed Care – PPO

## 2021-01-01 ENCOUNTER — Other Ambulatory Visit: Payer: Self-pay

## 2021-01-01 ENCOUNTER — Ambulatory Visit
Admission: RE | Admit: 2021-01-01 | Discharge: 2021-01-01 | Disposition: A | Discharge status: Home / Self Care | Payer: BC Managed Care – PPO | Source: Ambulatory Visit | Attending: Nurse Practitioner | Admitting: Nurse Practitioner

## 2021-01-01 DIAGNOSIS — N6320 Unspecified lump in the left breast, unspecified quadrant: Secondary | ICD-10-CM | POA: Insufficient documentation

## 2021-11-27 ENCOUNTER — Other Ambulatory Visit: Payer: Self-pay | Admitting: Nurse Practitioner

## 2021-11-27 DIAGNOSIS — Z1231 Encounter for screening mammogram for malignant neoplasm of breast: Secondary | ICD-10-CM

## 2022-05-20 ENCOUNTER — Ambulatory Visit
Admission: RE | Admit: 2022-05-20 | Discharge: 2022-05-20 | Disposition: A | Discharge status: Home / Self Care | Payer: BC Managed Care – PPO | Source: Ambulatory Visit | Attending: Nurse Practitioner | Admitting: Nurse Practitioner

## 2022-05-20 DIAGNOSIS — Z1231 Encounter for screening mammogram for malignant neoplasm of breast: Secondary | ICD-10-CM | POA: Insufficient documentation

## 2023-05-26 ENCOUNTER — Ambulatory Visit: Payer: BLUE CROSS/BLUE SHIELD

## 2023-05-26 DIAGNOSIS — K5730 Diverticulosis of large intestine without perforation or abscess without bleeding: Secondary | ICD-10-CM

## 2023-05-26 DIAGNOSIS — K449 Diaphragmatic hernia without obstruction or gangrene: Secondary | ICD-10-CM

## 2023-05-26 DIAGNOSIS — K222 Esophageal obstruction: Secondary | ICD-10-CM

## 2023-05-26 DIAGNOSIS — K208 Other esophagitis without bleeding: Secondary | ICD-10-CM

## 2023-05-26 DIAGNOSIS — Z83719 Family history of colon polyps, unspecified: Secondary | ICD-10-CM

## 2023-05-26 DIAGNOSIS — Z1211 Encounter for screening for malignant neoplasm of colon: Secondary | ICD-10-CM

## 2023-05-26 DIAGNOSIS — K2289 Other specified disease of esophagus: Secondary | ICD-10-CM

## 2023-07-22 ENCOUNTER — Other Ambulatory Visit: Payer: Self-pay | Admitting: Nurse Practitioner

## 2023-07-22 DIAGNOSIS — Z1231 Encounter for screening mammogram for malignant neoplasm of breast: Secondary | ICD-10-CM

## 2023-07-28 ENCOUNTER — Ambulatory Visit
Admission: RE | Admit: 2023-07-28 | Discharge: 2023-07-28 | Disposition: A | Discharge status: Home / Self Care | Payer: BC Managed Care – PPO | Source: Ambulatory Visit | Attending: Nurse Practitioner | Admitting: Nurse Practitioner

## 2023-07-28 ENCOUNTER — Encounter: Payer: Self-pay | Admitting: Radiology

## 2023-07-28 DIAGNOSIS — Z1231 Encounter for screening mammogram for malignant neoplasm of breast: Secondary | ICD-10-CM | POA: Diagnosis not present

## 2023-11-03 ENCOUNTER — Ambulatory Visit
Admission: RE | Admit: 2023-11-03 | Discharge: 2023-11-03 | Disposition: A | Discharge status: Home / Self Care | Payer: BC Managed Care – PPO | Source: Ambulatory Visit

## 2023-11-03 VITALS — BP 131/79 | HR 70 | Temp 97.8°F | Resp 16

## 2023-11-03 DIAGNOSIS — H6501 Acute serous otitis media, right ear: Secondary | ICD-10-CM

## 2023-11-03 MED ORDER — AMOXICILLIN-POT CLAVULANATE 875-125 MG PO TABS
1.0000 | ORAL_TABLET | Freq: Two times a day (BID) | ORAL | 0 refills | Status: AC
Start: 2023-11-03 — End: ?

## 2023-11-03 NOTE — ED Triage Notes (Signed)
Pt presents with ear pain in both ears, worse on the right that goes down to her jaw, that started 3 weeks ago.

## 2023-11-03 NOTE — ED Provider Notes (Signed)
Renaldo Fiddler    CSN: [id number] Arrival date & time: 11/03/23  1625      History   Chief Complaint Chief Complaint  Patient presents with   Otalgia    HPI SHAKIRAH BATAILLE is a 57 y.o. female.   Patient presents for evaluation of bilateral ear pain, right side worse than left present for 3 weeks.  Pain is describing as a intermittent throbbing sensation with mild pruritus.  Has been experiencing sinus drainage but denies congestion or rhinorrhea.  Has attempted rubbing alcohol with minimal improvement.  Denies changes in hearing, sensation of fullness or fluid, fever.  Past Medical History:  Diagnosis Date   Anxiety     Patient Active Problem List   Diagnosis Date Noted   Appendicitis    Acute appendicitis with localized peritonitis     Past Surgical History:  Procedure Laterality Date   LAPAROSCOPIC APPENDECTOMY N/A 07/15/2016   Procedure: APPENDECTOMY LAPAROSCOPIC;  Surgeon: Leafy Ro, MD;  Location: [facility] ORS;  Service: General;  Laterality: N/A;    OB History   No obstetric history on file.      Home Medications    Prior to Admission medications   Medication Sig Start Date End Date Taking? Authorizing Provider  amoxicillin-clavulanate (AUGMENTIN) 875-125 MG tablet Take 1 tablet by mouth every 12 (twelve) hours. 11/03/23  Yes Evelynn Hench R, NP  escitalopram (LEXAPRO) 20 MG tablet Take 20 mg by mouth daily. 06/24/16  Yes [provider]  omeprazole (PRILOSEC) 20 MG capsule Take 20 mg by mouth daily.   Yes [provider]  tirzepatide (ZEPBOUND) 5 MG/0.5ML Pen Inject 5 mg into the skin once a week.   Yes [provider]  norethindrone-ethinyl estradiol (FEMHRT 1/5) 1-5 MG-MCG TABS tablet Take 1 tablet by mouth daily.    [provider]    Family History Family History  Problem Relation Age of Onset   Breast cancer Paternal Aunt    Heart disease Mother    Hypertension Mother    Hypertension Father      Social History Social History   Tobacco Use   Smoking status: Never   Smokeless tobacco: Never  Substance Use Topics   Alcohol use: Yes    Comment: occasionally   Drug use: No     Allergies   Etodolac   Review of Systems Review of Systems   Physical Exam Triage Vital Signs ED Triage Vitals  Encounter Vitals Group     BP 11/03/23 1659 131/79     Systolic BP Percentile --      Diastolic BP Percentile --      Pulse Rate 11/03/23 1659 70     Resp 11/03/23 1659 16     Temp 11/03/23 1659 97.8 F (36.6 C)     Temp Source 11/03/23 1659 Oral     SpO2 11/03/23 1659 98 %     Weight --      Height --      Head Circumference --      Peak Flow --      Pain Score 11/03/23 1700 7     Pain Loc --      Pain Education --      Exclude from Growth Chart --    No data found.  Updated Vital Signs BP 131/79 (BP Location: Left Arm)   Pulse 70   Temp 97.8 F (36.6 C) (Oral)   Resp 16   LMP 06/09/2017 Comment: takes continuous  SpO2  98%   Visual Acuity Right Eye Distance:   Left Eye Distance:   Bilateral Distance:    Right Eye Near:   Left Eye Near:    Bilateral Near:     Physical Exam Constitutional:      Appearance: Normal appearance.  HENT:     Right Ear: Hearing, ear canal and external ear normal. Tympanic membrane is erythematous.     Left Ear: Hearing, tympanic membrane, ear canal and external ear normal.  Eyes:     Extraocular Movements: Extraocular movements intact.  Pulmonary:     Effort: Pulmonary effort is normal.  Neurological:     Mental Status: She is alert and oriented to person, place, and time. Mental status is at baseline.      UC Treatments / Results  Labs (all labs ordered are listed, but only abnormal results are displayed) Labs Reviewed - No data to display  EKG   Radiology No results found.  Procedures Procedures (including critical care time)  Medications Ordered in UC Medications - No data to display  Initial  Impression / Assessment and Plan / UC Course  I have reviewed the triage vital signs and the nursing notes.  Pertinent labs & imaging results that were available during my care of the patient were reviewed by me and considered in my medical decision making (see chart for details).  Nonrecurrent acute serous otitis media of the right ear  Erythema noted to the right tympanic membrane, no abnormality to the left ear, prescribed Augmentin and discussed administration, may use over-the-counter analgesics and warm compresses, she is experiencing sinus drainage recommended mucolytic or decongestant to further ensure sinuses are not contributing to symptoms, advised against ear cleaning during treatment and advised follow-up if symptoms persist worsen or recur Final Clinical Impressions(s) / UC Diagnoses   Final diagnoses:  Non-recurrent acute serous otitis media of right ear     Discharge Instructions      Today you are being treated for an infection of the eardrum  Take amoxicillin twice daily for 10 days, you should begin to see improvement after 48 hours of medication use and then it should progressively get better  Jaw coughing May be related to your infection, please monitor and if symptoms continue to persist or worsen you may be reevaluated for temporal mandibular  joint irritation  Ears can be affected by congestion and sinus drainage, begin use of Mucinex or Sudafed to further assist with symptoms and ensure that the sinuses are not worsening your ear symptoms  You may use Tylenol or ibuprofen for management of discomfort  May hold warm compresses to the ear for additional comfort  Please not attempted any ear cleaning or object or fluid placement into the ear canal to prevent further irritation  Please follow-up if symptoms continue to persist or worsen past use of your medicine    ED Prescriptions     Medication Sig Dispense Auth. Provider   amoxicillin-clavulanate  (AUGMENTIN) 875-125 MG tablet Take 1 tablet by mouth every 12 (twelve) hours. 14 tablet Json Koelzer, Elita Boone, NP      PDMP not reviewed this encounter.   Valinda Hoar, NP 11/03/23 1724

## 2023-11-03 NOTE — Discharge Instructions (Signed)
Today you are being treated for an infection of the eardrum  Take amoxicillin twice daily for 10 days, you should begin to see improvement after 48 hours of medication use and then it should progressively get better  Jaw coughing May be related to your infection, please monitor and if symptoms continue to persist or worsen you may be reevaluated for temporal mandibular  joint irritation  Ears can be affected by congestion and sinus drainage, begin use of Mucinex or Sudafed to further assist with symptoms and ensure that the sinuses are not worsening your ear symptoms  You may use Tylenol or ibuprofen for management of discomfort  May hold warm compresses to the ear for additional comfort  Please not attempted any ear cleaning or object or fluid placement into the ear canal to prevent further irritation  Please follow-up if symptoms continue to persist or worsen past use of your medicine

## 2024-01-13 ENCOUNTER — Other Ambulatory Visit: Payer: Self-pay | Admitting: Medical Genetics

## 2024-01-15 ENCOUNTER — Other Ambulatory Visit
Admission: RE | Admit: 2024-01-15 | Discharge: 2024-01-15 | Disposition: A | Discharge status: Home / Self Care | Payer: Self-pay | Source: Ambulatory Visit | Attending: Medical Genetics | Admitting: Medical Genetics

## 2024-01-25 LAB — GENECONNECT MOLECULAR SCREEN: Genetic Analysis Overall Interpretation: NEGATIVE

## 2024-11-01 ENCOUNTER — Other Ambulatory Visit: Payer: Self-pay | Admitting: Nurse Practitioner

## 2024-11-01 DIAGNOSIS — Z1231 Encounter for screening mammogram for malignant neoplasm of breast: Secondary | ICD-10-CM

## 2024-12-06 ENCOUNTER — Inpatient Hospital Stay
Admission: RE | Admit: 2024-12-06 | Discharge: 2024-12-06 | Attending: Nurse Practitioner | Admitting: Nurse Practitioner

## 2024-12-06 DIAGNOSIS — Z1231 Encounter for screening mammogram for malignant neoplasm of breast: Secondary | ICD-10-CM | POA: Diagnosis present
